# Patient Record
Sex: Female | Born: 1965 | Race: White | Hispanic: No | Marital: Married | Smoking: Never smoker
Health system: Southern US, Community
[De-identification: ages and names within clinical notes are randomized; demographics above are authoritative.]

## PROBLEM LIST (undated history)

## (undated) DIAGNOSIS — G43909 Migraine, unspecified, not intractable, without status migrainosus: Secondary | ICD-10-CM

## (undated) HISTORY — DX: Migraine, unspecified, not intractable, without status migrainosus: G43.909

---

## 2016-09-04 DIAGNOSIS — K529 Noninfective gastroenteritis and colitis, unspecified: Secondary | ICD-10-CM | POA: Diagnosis not present

## 2016-09-04 DIAGNOSIS — M940 Chondrocostal junction syndrome [Tietze]: Secondary | ICD-10-CM | POA: Diagnosis not present

## 2017-10-14 DIAGNOSIS — Z1231 Encounter for screening mammogram for malignant neoplasm of breast: Secondary | ICD-10-CM | POA: Diagnosis not present

## 2017-10-20 DIAGNOSIS — N6002 Solitary cyst of left breast: Secondary | ICD-10-CM | POA: Diagnosis not present

## 2018-01-06 DIAGNOSIS — Z0000 Encounter for general adult medical examination without abnormal findings: Secondary | ICD-10-CM | POA: Diagnosis not present

## 2018-01-06 DIAGNOSIS — G43009 Migraine without aura, not intractable, without status migrainosus: Secondary | ICD-10-CM | POA: Diagnosis not present

## 2018-01-08 ENCOUNTER — Encounter: Payer: Self-pay | Admitting: Neurology

## 2018-02-02 ENCOUNTER — Ambulatory Visit (INDEPENDENT_AMBULATORY_CARE_PROVIDER_SITE_OTHER): Payer: 59 | Admitting: Neurology

## 2018-02-02 ENCOUNTER — Encounter: Payer: Self-pay | Admitting: Neurology

## 2018-02-02 VITALS — BP 116/82 | HR 83 | Ht 61.0 in | Wt 136.0 lb

## 2018-02-02 DIAGNOSIS — R51 Headache: Secondary | ICD-10-CM

## 2018-02-02 DIAGNOSIS — G4486 Cervicogenic headache: Secondary | ICD-10-CM

## 2018-02-02 DIAGNOSIS — R519 Headache, unspecified: Secondary | ICD-10-CM

## 2018-02-02 MED ORDER — TIZANIDINE HCL 4 MG PO TABS: ORAL_TABLET | ORAL | 11 refills | Status: AC

## 2018-02-02 NOTE — Patient Instructions (Addendum)
1. Schedule MRI brain without contrast  We have sent a [facility] to [facility] for [facility] and they will call you directly to schedule your appt. They are located at [address] [facility] [address] If you need to contact them directly please call [phone].   2. Take Tizanidine 4mg  as needed at onset of neck pain/headache. Try taking it at bedtime first, may take up to three times a day 3. Update me in a month on how you are feeling and we can adjust treatment as needed 4. Follow-up in 6 months or so, call for any changes

## 2018-02-02 NOTE — Progress Notes (Signed)
NEUROLOGY CONSULTATION NOTE  Zoii Florer MRN: [id number] DOB: June 06, 1966  Referring provider: Dr. Lia Hopping Primary care provider: Dr. Lia Hopping  Reason for consult:  Headaches, paresthesias  Dear Dr Olena Leatherwood:  Thank you for your kind referral of Daneya Hartgrove for consultation of the above symptoms. Although her history is well known to you, please allow me to reiterate it for the purpose of our medical record. She is alone in the office today. Records and images were personally reviewed where available.  HISTORY OF PRESENT ILLNESS: This is a very pleasant 52 year old right-handed woman with a history of migraines, presenting for a change in her headaches. She reports a history of migraines since childhood with one-sided throbbing pain sometimes like her eye would pop out, with associated nausea and phonophobia. These occur a few times a year, last episode was a few months ago. A few years ago, she started having a different kind of headache that would start at the base of her neck radiating to the back of her head. The pain in her neck would almost be as bad as the headache, with a constant pressure sensation. Pain is usually one-sided, more on the left side, running down her neck and shoulder. It would not completely debilitate her, no associated nausea, but pain would last for 3 days and it would be harder to function by the third day. A couple of times there is a hot sensation in the back of her neck like a liniment was applied. These headaches occur once a week with no clear triggers. She alternates between Tylenol and Advil once a week, she tried Flexeril to go to sleep, but it has not helped much. One time the pain in her neck and back of head woke her up from sleep. In between these episodes, she has no constant neck pain. She has also noticed a different type of pain in her scalp, "like someone hit my skull" and when she lifts her hair, there would be sensitivity. When this  occurs, she also notices a numb pressure (not painful) on the right side of her face "like a balloon." This occurs around 1-2 times a month. She has also been diagnosed with ocular migraines a few years ago, with vision changes where she sees a kaleidoscope or "water running down" without headache. She has seen her eye doctor recently with normal eye exam. She denies any dizziness, diplopia, dysarthria/dysphagia, back pain, focal numbness/tingling/weakness, bowel/bladder dysfunction. She usually gets 6-7 hours of sleep and states she does not feel great/refreshed, she has daytime drowsiness, no snoring. Her mother and 3 children have migraines.   PAST MEDICAL HISTORY: Past Medical History:  Diagnosis Date  . Migraines     PAST SURGICAL HISTORY: History reviewed. No pertinent surgical history.  MEDICATIONS: Current Outpatient Medications on File Prior to Visit  Medication Sig Dispense Refill  . acetaminophen (TYLENOL) 500 MG tablet Take 500 mg by mouth every 8 (eight) hours as needed.    Marland Kitchen ibuprofen (ADVIL,MOTRIN) 200 MG tablet Take 200 mg by mouth every 6 (six) hours as needed.     No current facility-administered medications on file prior to visit.     ALLERGIES: No Known Allergies  FAMILY HISTORY: Family History  Problem Relation Age of Onset  . Lung cancer Father 42  . Asthma Sister   . Diabetes Maternal Grandmother   . Leukemia Paternal Grandmother     SOCIAL HISTORY: Social History   Socioeconomic History  . Marital status: Married  Spouse name: Dannielle HuhDanny  . Number of children: 4  . Years of education: Not on file  . Highest education level: 12th grade  Occupational History  . Occupation: logistics  Social Needs  . Financial resource strain: Not on file  . Food insecurity:    Worry: Not on file    Inability: Not on file  . Transportation needs:    Medical: Not on file    Non-medical: Not on file  Tobacco Use  . Smoking status: Never Smoker  . Smokeless tobacco:  Never Used  Substance and Sexual Activity  . Alcohol use: Yes    Comment: rarely wine  . Drug use: Never  . Sexual activity: Not on file  Lifestyle  . Physical activity:    Days per week: Not on file    Minutes per session: Not on file  . Stress: Not on file  Relationships  . Social connections:    Talks on phone: Not on file    Gets together: Not on file    Attends religious service: Not on file    Active member of club or organization: Not on file    Attends meetings of clubs or organizations: Not on file    Relationship status: Not on file  . Intimate partner violence:    Fear of current or ex partner: Not on file    Emotionally abused: Not on file    Physically abused: Not on file    Forced sexual activity: Not on file  Other Topics Concern  . Not on file  Social History Narrative   Patient is right-handed. She lives with her husband and children in  One level house. She drinks 1-2 cups of coffee a day, and occasionally exercises.    REVIEW OF SYSTEMS: Constitutional: No fevers, chills, or sweats, no generalized fatigue, change in appetite Eyes: No visual changes, double vision, eye pain Ear, nose and throat: No hearing loss, ear pain, nasal congestion, sore throat Cardiovascular: No chest pain, palpitations Respiratory:  No shortness of breath at rest or with exertion, wheezes GastrointestinaI: No nausea, vomiting, diarrhea, abdominal pain, fecal incontinence Genitourinary:  No dysuria, urinary retention or frequency Musculoskeletal:  + neck pain,no back pain Integumentary: No rash, pruritus, skin lesions Neurological: as above Psychiatric: No depression, insomnia, anxiety Endocrine: No palpitations, fatigue, diaphoresis, mood swings, change in appetite, change in weight, increased thirst Hematologic/Lymphatic:  No anemia, purpura, petechiae. Allergic/Immunologic: no itchy/runny eyes, nasal congestion, recent allergic reactions, rashes  PHYSICAL EXAM: Vitals:    02/02/18 1306  BP: 116/82  Pulse: 83  SpO2: 98%   General: No acute distress Head:  Normocephalic/atraumatic, no temporal or occipital tenderness Eyes: Fundoscopic exam shows bilateral sharp discs, no vessel changes, exudates, or hemorrhages Neck: supple, no paraspinal tenderness, full range of motion Back: No paraspinal tenderness Heart: regular rate and rhythm Lungs: Clear to auscultation bilaterally. Vascular: No carotid bruits. Skin/Extremities: No rash, no edema Neurological Exam: Mental status: alert and oriented to person, place, and time, no dysarthria or aphasia, Fund of knowledge is appropriate.  Recent and remote memory are intact.  Attention and concentration are normal.    Able to name objects and repeat phrases. Cranial nerves: CN I: not tested CN II: pupils equal, round and reactive to light, visual fields intact, fundi unremarkable. CN III, IV, VI:  full range of motion, no nystagmus, no ptosis CN V: facial sensation intact CN VII: upper and lower face symmetric CN VIII: hearing intact to finger rub CN IX, X:  gag intact, uvula midline CN XI: sternocleidomastoid and trapezius muscles intact CN XII: tongue midline Bulk & Tone: normal, no fasciculations. Motor: 5/5 throughout with no pronator drift. Sensation: intact to light touch, cold, pin, vibration and joint position sense.  No extinction to double simultaneous stimulation.  Romberg test negative Deep Tendon Reflexes: +2 throughout, no ankle clonus Plantar responses: downgoing bilaterally Cerebellar: no incoordination on finger to nose, heel to shin. No dysdiadochokinesia Gait: narrow-based and steady, able to tandem walk adequately. Tremor: none  IMPRESSION: This is a very pleasant 52 year old right-handed woman with a history of migraines since childhood, presenting with a change in headaches, now with headaches starting in her neck radiating up the back of her head, more on the left side, suggestive of  cervicogenic headaches. She does not give a clear history for occipital neuralgia, but does report scalp sensitivity at times. She also describes a pressure sensation over the right side of her face. Her neurological exam is normal. MRI brain without contrast will be ordered to assess for underlying structural abnormality. We discussed trying a different prn muscle relaxant Tizanidine to help with neck pain/headache, side effects were discussed. If no improvement, we discussed starting low dose nortriptyline. She will update us in a month and will follow-up in 6 months. She knows to call for any changes.   Thank you for allowing me to participate in the care of this patient. Please do not hesitate to call for any questions or concerns.   Patrcia DollyKaren Aquino, M.D.  CC: Dr. Olena LeatherwoodHasanaj

## 2018-02-17 ENCOUNTER — Ambulatory Visit
Admission: RE | Admit: 2018-02-17 | Discharge: 2018-02-17 | Disposition: A | Discharge status: Home / Self Care | Payer: 59 | Source: Ambulatory Visit | Attending: Neurology | Admitting: Neurology

## 2018-02-17 DIAGNOSIS — R51 Headache: Principal | ICD-10-CM

## 2018-02-17 DIAGNOSIS — R519 Headache, unspecified: Secondary | ICD-10-CM

## 2018-02-17 DIAGNOSIS — G4486 Cervicogenic headache: Secondary | ICD-10-CM

## 2018-02-17 DIAGNOSIS — G43909 Migraine, unspecified, not intractable, without status migrainosus: Secondary | ICD-10-CM | POA: Diagnosis not present

## 2018-02-18 ENCOUNTER — Telehealth: Payer: Self-pay

## 2018-02-18 NOTE — Telephone Encounter (Signed)
We had discussed starting a preventative medication, does she want to start this? This is more of a long-term treatment than for her current headache. She can have a migraine cocktail for current headache if she wants. For preventative, we had discussed starting low dose nortriptyline 10mg  qhs, main side effect is drowsiness. Thanks

## 2018-02-18 NOTE — Telephone Encounter (Signed)
-----   Message from Van Clines, MD sent at 02/18/2018  9:54 AM EDT ----- Pls let her know MRI brain looks good, no evidence of tumor, stroke, or bleed. Thanks

## 2018-02-18 NOTE — Telephone Encounter (Signed)
Spoke with relaying below.  She states that she is on day 3 of current headache.  On Tuesday she had a headache, took a Tizanidine and went to bed.  Woke up Wednesday with the same headache.  Today she states that she is going home at lunch and will take a flexeril in hopes that that will help.

## 2018-02-24 MED ORDER — NORTRIPTYLINE HCL 10 MG PO CAPS: 10.0000 mg | ORAL_CAPSULE | Freq: Every day | ORAL | 3 refills | Status: AC

## 2018-02-24 NOTE — Telephone Encounter (Signed)
Spoke with pt.  She states that her headache has been off and on since our last conversation.  Relayed message below about Nortriptyline - pt would like to start.  Verified pt's preferred pharmacy.  Rx sent to [facility] in [place] - Nortriptyline 10mg  #90 with 3 refills.

## 2018-02-24 NOTE — Addendum Note (Signed)
Addended by: Horatio Pel on: 02/24/2018 01:17 PM   Modules accepted: Orders

## 2018-02-26 ENCOUNTER — Encounter

## 2018-02-26 ENCOUNTER — Ambulatory Visit: Payer: Self-pay | Admitting: Neurology

## 2018-03-09 ENCOUNTER — Encounter (INDEPENDENT_AMBULATORY_CARE_PROVIDER_SITE_OTHER): Payer: Self-pay

## 2018-03-09 ENCOUNTER — Telehealth: Payer: Self-pay | Admitting: Neurology

## 2018-03-09 NOTE — Telephone Encounter (Signed)
Patient left a voicemail stating she needed to speak with the nurse regarding her medication. Please call her back at [phone]. Thanks!

## 2018-03-09 NOTE — Telephone Encounter (Signed)
Spoke with pt.  She states that she started taking Nortriptyline on 02/28/18. The next morning had a migraine.  Took medication that night, had a migraine the next morning.  Pt stopped taking Nortriptyline after 3 days.  She alternated tylenol and ibuprofen.  Migraine subsided.  Pt just wanted to keep us up-to-date.   \pt had trouble signing up for MyChart.  MyChart sign-up instructions printed and will be sent to pt.

## 2018-03-10 NOTE — Telephone Encounter (Signed)
Does she want to try a different medication? We can do low dose gabapentin 100mg  qhs x 1 week, then increase to 200mg  qhs. Main side effect is drowsiness. Thanks

## 2018-03-11 NOTE — Telephone Encounter (Signed)
MyChart message sent to pt in regards to message below.

## 2018-03-31 ENCOUNTER — Telehealth: Payer: Self-pay

## 2018-03-31 MED ORDER — GABAPENTIN 100 MG PO CAPS: ORAL_CAPSULE | ORAL | 6 refills | Status: DC

## 2018-03-31 NOTE — Telephone Encounter (Signed)
Gabapentin Rx sent to pharmacy

## 2018-04-02 ENCOUNTER — Other Ambulatory Visit: Payer: Self-pay

## 2018-04-02 DIAGNOSIS — M542 Cervicalgia: Secondary | ICD-10-CM

## 2018-04-02 DIAGNOSIS — R51 Headache: Principal | ICD-10-CM

## 2018-04-02 DIAGNOSIS — R519 Headache, unspecified: Secondary | ICD-10-CM

## 2018-04-02 DIAGNOSIS — G4486 Cervicogenic headache: Secondary | ICD-10-CM

## 2018-04-02 DIAGNOSIS — M79601 Pain in right arm: Secondary | ICD-10-CM

## 2018-04-07 ENCOUNTER — Telehealth: Payer: Self-pay | Admitting: Neurology

## 2018-04-07 NOTE — Telephone Encounter (Signed)
Patient has a question concerning her physical therapy and medication. She is wanting to know about being taken out of work. Please call her back at [phone]. Thanks!

## 2018-04-07 NOTE — Telephone Encounter (Signed)
Responded to via Mychart

## 2018-04-13 ENCOUNTER — Ambulatory Visit: Payer: 59 | Attending: Neurology | Admitting: Physical Therapy

## 2018-04-13 ENCOUNTER — Encounter: Payer: Self-pay | Admitting: Physical Therapy

## 2018-04-13 DIAGNOSIS — M25511 Pain in right shoulder: Secondary | ICD-10-CM | POA: Diagnosis present

## 2018-04-13 DIAGNOSIS — R519 Headache, unspecified: Secondary | ICD-10-CM

## 2018-04-13 DIAGNOSIS — R51 Headache: Secondary | ICD-10-CM | POA: Insufficient documentation

## 2018-04-13 DIAGNOSIS — M542 Cervicalgia: Secondary | ICD-10-CM | POA: Insufficient documentation

## 2018-04-13 NOTE — Therapy (Signed)
[facility] [address] [place], [place], [address] Phone: [phone]   Fax:  [phone]  Physical Therapy Evaluation  Patient Details  Name: Victoria Wilkins MRN: [id number] Date of Birth: 1966/01/01 Referring Provider (PT): Patrcia Dolly MD   Encounter Date: 04/13/2018  PT End of Session - 04/13/18 1038    Visit Number  1    Number of Visits  12    Date for PT Re-Evaluation  05/25/18    Authorization Type  [employer]    PT Start Time  0850    PT Stop Time  0935    PT Time Calculation (min)  45 min    Activity Tolerance  Patient tolerated treatment well       Past Medical History:  Diagnosis Date  . Migraines     History reviewed. No pertinent surgical history.  There were no vitals filed for this visit.   Subjective Assessment - 04/13/18 0855    Subjective  Pt reports migranes since she was a kid. She has been having consistent neck pain and Rt arm pain for last few weeks that is getting worse and aggravated by office and sitting work and turning to reach behind her. She relays referred pain down to elbow that maybe feels numb but denies tingling.  She experiences some visual distrurbances, warm sensations in her neck, She does not get a headache/migraine every week but does get one at least every other week lasting 2-3 days at a time. She saw Neurologist who prescribed gabapentin which may shorten duration of headaches to one day but has not helped with neck/arm pain.     Pertinent History  chronic migranes since kid    Limitations  Reading;Lifting;House hold activities    Diagnostic tests  MRI of brain, negative    Currently in Pain?  Yes    Pain Score  5     Pain Location  Neck    Pain Orientation  Right;Left    Pain Descriptors / Indicators  Aching;Constant    Pain Type  Acute pain   acute on chronic   Pain Radiating Towards  Rt arm down to elbow    Pain Onset  1 to 4 weeks ago    Pain Frequency  Constant     Aggravating Factors   looking down, reaching    Pain Relieving Factors  heat, maybe meds         [facility] PT Assessment - 04/13/18 0001      Assessment   Medical Diagnosis  cervicogenic headache, neck and Rt arm pain    Referring Provider (PT)  Patrcia Dolly MD    Onset Date/Surgical Date  --   4 week acute on chronic   Next MD Visit  --   not until march 2020   Prior Therapy  none      Precautions   Precautions  None      Balance Screen   Has the patient fallen in the past 6 months  No      Home Environment   Living Environment  Private residence      Prior Function   Level of Independence  Independent   currently still Ind but pain with anything reaching   Vocation  Full time employment   currently taken out of work a couple weeks by MD   [employer] work      Cognition   Overall Cognitive Status  Within Functional Limits for tasks assessed  Attention  Focused      Sensation   Light Touch  Appears Intact      Posture/Postural Control   Posture Comments  mild fwd head and rounded shoulders      ROM / Strength   AROM / PROM / Strength  AROM;PROM;Strength      AROM   Overall AROM Comments  % available, limited by pain, PROM WNL    AROM Assessment Site  Cervical;Shoulder    Right/Left Shoulder  Right    Right Shoulder Flexion  --   WFL   Right Shoulder ABduction  90 Degrees    Right Shoulder Internal Rotation  --   WFL   Right Shoulder External Rotation  --   WFL   Cervical Flexion  25% available    Cervical Extension  25%    Cervical - Right Side Bend  25%    Cervical - Left Side Bend  25%    Cervical - Right Rotation  75%    Cervical - Left Rotation  50%      PROM   Overall PROM Comments  neck and shoulder WNL      Strength   Overall Strength Comments  elbow WNL bilat, Lt shoulder WNL    Strength Assessment Site  Shoulder    Right/Left Shoulder  Right    Right Shoulder Flexion  4+/5    Right Shoulder ABduction  4/5    Right  Shoulder Internal Rotation  4+/5    Right Shoulder External Rotation  4/5      Flexibility   Soft Tissue Assessment /Muscle Length  --   tight suboccipitals, levator, UT     Palpation   Spinal mobility  decreased    Palpation comment  TTP in UT, cervical P.S, and suboccipitals, gentle palpaiton caused "tingling down arms and legs"      Special Tests   Other special tests  + spurlings test for spinal compression and facet arthropathy, inconclusive result with manual traction, + Rt shoulder impingment tests      Transfers   Comments  independent bed and chair tfers      Ambulation/Gait   Gait Comments  WNL gait pattern                Objective measurements completed on examination: See above findings.      [facility] Adult PT Treatment/Exercise - 04/13/18 0001      Modalities   Modalities  Moist Heat      Moist Heat Therapy   Number Minutes Moist Heat  10 Minutes   with HEP review   Moist Heat Location  Cervical      Manual Therapy   Manual therapy comments  neck PROM 5 reps each plane, Suboccipital release 3 min, manual traction with towel 30 sec X 3             PT Education - 04/13/18 1004    Education Details  HEP, POC, exam findings    Person(s) Educated  Patient    Methods  Explanation;Demonstration;Verbal cues;Handout    Comprehension  Verbalized understanding;Need further instruction          PT Long Term Goals - 04/13/18 1056      PT LONG TERM GOAL #1   Title  Pt will improve neck and Rt shoulder ROM to [facility]. 6 weeks 05/25/18    Status  New      PT LONG TERM GOAL #2   Title  Pt will be  I and compliant with HEP. 6 weeks 05/25/18    Status  New      PT LONG TERM GOAL #3   Title  Pt will report reduced headaches to no more than 1-2 per month. 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 6 weeks 05/25/18    Status  New              Plan - 04/13/18 1041    Clinical Impression Statement  Pt presents with neck and Rt arm pain, cervicogenic headaches. Headaches are acute on chronic and somewhat managed by new meds which delay duration of headaches to one day vs 2-3. She has good PROM but decreased neck and shoulder ROM due to pain, decreased Rt shoulder strength, + impingment signs on Rt shoulder, increased tightness and compression on neck paraspinals, sub occipitals, and traps, and incrased pain limiting her function. She has inconsistent radicular signs and MRI of brain was neg however she has not had imaging of her neck and shoulder. She will benefit from skilled PT to address her deficits and improve function.     History and Personal Factors relevant to plan of care:  chronic nature of headaches    Clinical Presentation  Evolving    Clinical Presentation due to:  worsening arm/neck pain over last 3-4 weeks    Clinical Decision Making  Moderate    Rehab Potential  Fair    PT Frequency  2x / week    PT Duration  6 weeks    PT Treatment/Interventions  Cryotherapy;Electrical Stimulation;Iontophoresis 4mg /ml Dexamethasone;Moist Heat;Traction;Ultrasound;Therapeutic exercise;Therapeutic activities;Neuromuscular re-education;Manual techniques;Passive range of motion;Dry needling;Taping;Spinal Manipulations    PT Next Visit Plan  review hep for gentle neck and shoulder ROM and stretching, consider MT for STM, traction, Suboccip relase, C-T spine mobs and DN?    PT Home Exercise Plan  [id number]    Consulted and Agree with Plan of Care  Patient       Patient will benefit from skilled therapeutic intervention in order to improve the following deficits and impairments:  Decreased activity tolerance, Decreased endurance, Decreased range of motion, Decreased strength, Hypomobility, Increased fascial restricitons, Increased muscle spasms, Postural dysfunction, Pain  Visit Diagnosis: Cervicalgia  Acute pain of right  shoulder  Chronic intractable headache, unspecified headache type     Problem List There are no active problems to display for this patient.   April Manson, PT, DPT 04/13/2018, 11:00 AM  [facility] [address] [place], [place], [address] Phone: [phone]   Fax:  [phone]  Name: Victoria Wilkins MRN: [id number] Date of Birth: [birth year]

## 2018-04-16 ENCOUNTER — Other Ambulatory Visit: Payer: Self-pay | Admitting: Neurology

## 2018-04-16 ENCOUNTER — Ambulatory Visit: Payer: 59 | Attending: Neurology | Admitting: Physical Therapy

## 2018-04-16 DIAGNOSIS — G8929 Other chronic pain: Secondary | ICD-10-CM

## 2018-04-16 DIAGNOSIS — M25511 Pain in right shoulder: Secondary | ICD-10-CM | POA: Insufficient documentation

## 2018-04-16 DIAGNOSIS — R519 Headache, unspecified: Secondary | ICD-10-CM

## 2018-04-16 DIAGNOSIS — M542 Cervicalgia: Secondary | ICD-10-CM | POA: Insufficient documentation

## 2018-04-16 DIAGNOSIS — R51 Headache: Secondary | ICD-10-CM | POA: Diagnosis present

## 2018-04-16 MED ORDER — GABAPENTIN 100 MG PO CAPS: ORAL_CAPSULE | ORAL | 6 refills | Status: DC

## 2018-04-16 NOTE — Therapy (Signed)
[facility] [address] [place], [place], [address] Phone: [phone]   Fax:  [phone]  Physical Therapy Treatment  Patient Details  Name: Victoria Wilkins MRN: [id number] Date of Birth: [birth year] Referring Provider (PT): Patrcia Dolly MD   Encounter Date: 04/16/2018  PT End of Session - 04/16/18 1108    Visit Number  2    Number of Visits  12    Date for PT Re-Evaluation  05/25/18    Authorization Type  UHC    PT Start Time  0850    PT Stop Time  0940    PT Time Calculation (min)  50 min    Activity Tolerance  Patient tolerated treatment well    Behavior During Therapy  [facility] for tasks assessed/performed       Past Medical History:  Diagnosis Date  . Migraines     No past surgical history on file.  There were no vitals filed for this visit.  Subjective Assessment - 04/16/18 1102    Subjective  Pt reports she had a bad day yesterday with posterior head/neck pain but it is feeling better today. She relays compliance with HEP. She relays she was also reached out to MD about trying to have her neck imaged or have an ortho consult for neck.    Currently in Pain?  Yes    Pain Score  5     Pain Location  Neck    Pain Orientation  Right;Posterior    Pain Descriptors / Indicators  Aching;Tightness;Numbness    Pain Type  Acute pain    Pain Radiating Towards  Rt UE    Pain Onset  1 to 4 weeks ago    Pain Frequency  Intermittent                       [facility] Adult PT Treatment/Exercise - 04/16/18 0001      Exercises   Exercises  Neck      Neck Exercises: Seated   Neck Retraction  20 reps      Modalities   Modalities  Electrical Stimulation;Moist Heat      Moist Heat Therapy   Number Minutes Moist Heat  15 Minutes    Moist Heat Location  Cervical      Electrical Stimulation   Electrical Stimulation Location  cervical    Electrical Stimulation Action  IFC    Electrical Stimulation  Parameters  80-134mhz to tolearance    Electrical Stimulation Goals  Pain;Tone      Manual Therapy   Manual therapy comments  gentle manual traction with towel, poor tolerance to suboccipital relase so instead she was moved to sitting and STM perfomed to subociptals, paraspinals, and traps      Neck Exercises: Stretches   Upper Trapezius Stretch  Right;Left;2 reps;30 seconds    Levator Stretch  Right;Left;2 reps;30 seconds             PT Education - 04/16/18 1108    Education Details  HEP review    Person(s) Educated  Patient    Methods  Explanation;Demonstration    Comprehension  Verbalized understanding;Returned demonstration          PT Long Term Goals - 04/13/18 1056      PT LONG TERM GOAL #1   Title  Pt will improve neck and Rt shoulder ROM to [facility]. 6 weeks 05/25/18    Status  New      PT LONG  TERM GOAL #2   Title  Pt will be I and compliant with HEP. 6 weeks 05/25/18    Status  New      PT LONG TERM GOAL #3   Title  Pt will report reduced headaches to no more than 1-2 per month. 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 6 weeks 05/25/18    Status  New            Plan - 04/16/18 1109    Clinical Impression Statement  Pt was trialed today with TENS along with heat in efforts to try to reduce pain, guarding, and spasm prior to stretching and manual therapy. She expresses this may have helped. HEP was reviewed and she showed good unstanding and technique with HEP. Manual therapy performed for manual traction and sub occipital release, she has poor tolerance to pressure applied in cervical muscles for manual traction instead performed with PT gently pulling towel. Pt relays some dizziness when traction is released. During soft tissue massage she expresses her Rt ear popped and opened up as if it was closed or clogged before. PT will assess her response to manual  therapy next visit for effectiveness.     Rehab Potential  Fair    PT Frequency  2x / week    PT Duration  6 weeks    PT Treatment/Interventions  Cryotherapy;Electrical Stimulation;Iontophoresis 4mg /ml Dexamethasone;Moist Heat;Traction;Ultrasound;Therapeutic exercise;Therapeutic activities;Neuromuscular re-education;Manual techniques;Passive range of motion;Dry needling;Taping;Spinal Manipulations    PT Next Visit Plan  assess response to manual therapy and TENS, last visit. gentle neck and shoulder ROM and stretching, may begin neck isometrics, consider MT for STM, traction, Suboccip relase, C-T spine mobs and DN?    PT Home Exercise Plan  [id number]    Consulted and Agree with Plan of Care  Patient       Patient will benefit from skilled therapeutic intervention in order to improve the following deficits and impairments:  Decreased activity tolerance, Decreased endurance, Decreased range of motion, Decreased strength, Hypomobility, Increased fascial restricitons, Increased muscle spasms, Postural dysfunction, Pain  Visit Diagnosis: Cervicalgia  Acute pain of right shoulder  Chronic intractable headache, unspecified headache type     Problem List There are no active problems to display for this patient.   April Manson, PT,DPT 04/16/2018, 11:16 AM  [facility] [address] [place], [place], [address] Phone: [phone]   Fax:  [phone]  Name: Victoria Wilkins MRN: [id number] Date of Birth: Jun 18, 1965

## 2018-04-19 ENCOUNTER — Telehealth: Payer: Self-pay | Admitting: Neurology

## 2018-04-19 NOTE — Telephone Encounter (Signed)
Amil Amen called from [employer] and would like to speak with you. Please Call. Thanks

## 2018-04-20 ENCOUNTER — Ambulatory Visit: Payer: 59 | Admitting: Physical Therapy

## 2018-04-20 DIAGNOSIS — R51 Headache: Secondary | ICD-10-CM

## 2018-04-20 DIAGNOSIS — M542 Cervicalgia: Secondary | ICD-10-CM

## 2018-04-20 DIAGNOSIS — R519 Headache, unspecified: Secondary | ICD-10-CM

## 2018-04-20 DIAGNOSIS — M25511 Pain in right shoulder: Secondary | ICD-10-CM

## 2018-04-20 NOTE — Therapy (Signed)
[facility] [address] [place], [place], [address] Phone: [phone]   Fax:  [phone]  Physical Therapy Treatment  Patient Details  Name: Victoria Wilkins MRN: [id number] Date of Birth: [birth year] Referring Provider (PT): Patrcia Dolly MD   Encounter Date: 04/20/2018  PT End of Session - 04/20/18 0858    Visit Number  3    Number of Visits  12    Date for PT Re-Evaluation  05/25/18    Authorization Type  UHC    PT Start Time  0845    PT Stop Time  0935    PT Time Calculation (min)  50 min    Activity Tolerance  Patient tolerated treatment well    Behavior During Therapy  [facility] for tasks assessed/performed       Past Medical History:  Diagnosis Date  . Migraines     No past surgical history on file.  There were no vitals filed for this visit.  Subjective Assessment - 04/20/18 0855    Subjective  Pt relays she felt good the rest of the day friday after last session but then the headache came back on sunday and it felt like she was hit by 2X4. She relays MD has inc her gabapentin a litle bit. She relays she feels TENS helped some. She does relay the sharp pain in her upper Rt arm has improved.    Currently in Pain?  Yes    Pain Score  6     Pain Location  Neck   head and neck   Pain Orientation  Right;Left;Posterior    Pain Descriptors / Indicators  Aching;Tightness         [facility] PT Assessment - 04/20/18 0001      AROM   Overall AROM Comments  %limited    AROM Assessment Site  Cervical;Shoulder    Cervical Flexion  25%    Cervical Extension  50%    Cervical - Right Side Bend  75%    Cervical - Left Side Bend  25%    Cervical - Right Rotation  75%    Cervical - Left Rotation  75%                   [facility] Adult PT Treatment/Exercise - 04/20/18 0001      Exercises   Exercises  Neck      Neck Exercises: Seated   Other Seated Exercise  rows and scap retraction/bilat ER 15 sec ea, 5 sec  hold    Other Seated Exercise  seated neck rotation mob with movement with towel,  x10 each side hold 5 sec      Neck Exercises: Supine   Neck Retraction  20 reps      Modalities   Modalities  Electrical Stimulation;Moist Heat      Moist Heat Therapy   Number Minutes Moist Heat  15 Minutes    Moist Heat Location  Cervical      Electrical Stimulation   Electrical Stimulation Location  cervical    Electrical Stimulation Action  IFC    Electrical Stimulation Parameters  80-150 hz    Electrical Stimulation Goals  Pain;Tone      Manual Therapy   Manual therapy comments  PROM SB and ROT X 20 ea in supine, then pt prone for STM and gentle grade 1 cervical mobs central PA and Lt unilat       Neck Exercises: Stretches   Upper Trapezius Stretch  Right;Left;2 reps;30 seconds    Levator Stretch  Right;Left;2 reps;30 seconds                  PT Long Term Goals - 04/13/18 1056      PT LONG TERM GOAL #1   Title  Pt will improve neck and Rt shoulder ROM to [facility]. 6 weeks 05/25/18    Status  New      PT LONG TERM GOAL #2   Title  Pt will be I and compliant with HEP. 6 weeks 05/25/18    Status  New      PT LONG TERM GOAL #3   Title  Pt will report reduced headaches to no more than 1-2 per month. 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 6 weeks 05/25/18    Status  New            Plan - 04/20/18 0901    Clinical Impression Statement  Session began with MHP and TENS as she relays this helped some last session. She also says she has a TENS unit she got from family member she did not know how to position the pads so she was shown today. PT took picture with patients phone with her permission so she could see the set up at home along the cervical paraspinals bilat. Session again focused on gentle neck ROM with begining scapular and upper back strengthening followed by manual  therapy to decrease Soft tissue restrictions and increase spinal mobility.  She did have some improvements in neck ROM upon assessment, see updated measurements.    Rehab Potential  Fair    PT Frequency  2x / week    PT Duration  6 weeks    PT Treatment/Interventions  Cryotherapy;Electrical Stimulation;Iontophoresis 4mg /ml Dexamethasone;Moist Heat;Traction;Ultrasound;Therapeutic exercise;Therapeutic activities;Neuromuscular re-education;Manual techniques;Passive range of motion;Dry needling;Taping;Spinal Manipulations    PT Next Visit Plan  assess response to manual therapy and Trial of cervical mobs,gentle neck and shoulder ROM and stretching, may begin neck isometrics, consider MT for STM, traction, Suboccip relase, C-T spine mobs and DN?    PT Home Exercise Plan  [id number]    Consulted and Agree with Plan of Care  Patient       Patient will benefit from skilled therapeutic intervention in order to improve the following deficits and impairments:  Decreased activity tolerance, Decreased endurance, Decreased range of motion, Decreased strength, Hypomobility, Increased fascial restricitons, Increased muscle spasms, Postural dysfunction, Pain  Visit Diagnosis: Cervicalgia  Acute pain of right shoulder  Chronic intractable headache, unspecified headache type     Problem List There are no active problems to display for this patient.   April Manson, PT, DPT 04/20/2018, 10:28 AM  [facility] [address] [place], [place], [address] Phone: [phone]   Fax:  [phone]  Name: Victoria Wilkins MRN: [id number] Date of Birth: [birth year]

## 2018-04-23 ENCOUNTER — Ambulatory Visit: Payer: 59

## 2018-04-23 DIAGNOSIS — G8929 Other chronic pain: Secondary | ICD-10-CM

## 2018-04-23 DIAGNOSIS — R51 Headache: Secondary | ICD-10-CM

## 2018-04-23 DIAGNOSIS — R519 Headache, unspecified: Secondary | ICD-10-CM

## 2018-04-23 DIAGNOSIS — M542 Cervicalgia: Secondary | ICD-10-CM | POA: Diagnosis not present

## 2018-04-23 DIAGNOSIS — M25511 Pain in right shoulder: Secondary | ICD-10-CM

## 2018-04-23 NOTE — Therapy (Signed)
[facility] [address] [place], [place], [address] Phone: [phone]   Fax:  [phone]  Physical Therapy Treatment  Patient Details  Name: Victoria Wilkins MRN: [id number] Date of Birth: [birth year] Referring Provider (PT): Patrcia Dolly MD   Encounter Date: 04/23/2018  PT End of Session - 04/23/18 0849    Visit Number  4    Number of Visits  12    Date for PT Re-Evaluation  05/25/18    Authorization Type  UHC    PT Start Time  0849    PT Stop Time  0934    PT Time Calculation (min)  45 min    Equipment Utilized During Treatment  Gait belt    Activity Tolerance  Patient tolerated treatment well    Behavior During Therapy  [facility] for tasks assessed/performed       Past Medical History:  Diagnosis Date  . Migraines     History reviewed. No pertinent surgical history.  There were no vitals filed for this visit.  Subjective Assessment - 04/23/18 0850    Subjective  Pt stated she had a terrible headache monday and then caught the stomach bug but is feeling better today. HEP is going well but pt hasnt been able to do them the last few days due to being sick.     Pertinent History  chronic migranes since kid    Limitations  Reading;Lifting;House hold activities    Diagnostic tests  MRI of brain, negative    Currently in Pain?  Yes    Pain Score  3     Pain Location  Neck    Pain Orientation  Posterior    Pain Descriptors / Indicators  Aching;Other (Comment)   "something stuck in it"   Pain Type  Acute pain    Pain Onset  More than a month ago    Pain Frequency  Constant   pain level varies but constant   Aggravating Factors   sitting up, standing, activity    Pain Relieving Factors  heat, supporting the neck        [facility] Adult PT Treatment/Exercise - 04/23/18 1551      Neck Exercises: Seated   Other Seated Exercise  AAROM with wooden dowel in all ranges     Other Seated Exercise  Supine neck rotation with  towel under spine with neck resting on tennis balls      Modalities   Modalities  Moist Heat      Moist Heat Therapy   Number Minutes Moist Heat  10 Minutes    Moist Heat Location  Cervical      Neck Exercises: Stretches   Upper Trapezius Stretch  Right;Left;5 reps;30 seconds    Levator Stretch  Right;Left;5 reps;30 seconds    Neck Stretch  5 reps;30 seconds    Other Neck Stretches  Upper limb neural tension radial stretch while seated    Other Neck Stretches  AROM rotation with ball on wall in standing.         PT Long Term Goals - 04/13/18 1056      PT LONG TERM GOAL #1   Title  Pt will improve neck and Rt shoulder ROM to [facility]. 6 weeks 05/25/18    Status  New      PT LONG TERM GOAL #2   Title  Pt will be I and compliant with HEP. 6 weeks 05/25/18    Status  New  PT LONG TERM GOAL #3   Title  Pt will report reduced headaches to no more than 1-2 per month. 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 6 weeks 05/25/18    Status  New        Plan - 04/23/18 2342    Clinical Impression Statement  Todays skilled session focused on gentle neck/shoulder ROM/stretching to improve range in all directions. Pt should benefit from continued PT sessions to work toward goals.     Rehab Potential  Fair    PT Frequency  2x / week    PT Duration  6 weeks    PT Treatment/Interventions  Cryotherapy;Electrical Stimulation;Iontophoresis 4mg /ml Dexamethasone;Moist Heat;Traction;Ultrasound;Therapeutic exercise;Therapeutic activities;Neuromuscular re-education;Manual techniques;Passive range of motion;Dry needling;Taping;Spinal Manipulations    PT Next Visit Plan  assess response to manual therapy and Trial of cervical mobs,gentle neck and shoulder ROM and stretching, may begin neck isometrics, consider MT for STM, traction, Suboccip relase, C-T spine mobs and DN?    PT Home Exercise Plan   [id number]    Consulted and Agree with Plan of Care  Patient       Patient will benefit from skilled therapeutic intervention in order to improve the following deficits and impairments:  Decreased activity tolerance, Decreased endurance, Decreased range of motion, Decreased strength, Hypomobility, Increased fascial restricitons, Increased muscle spasms, Postural dysfunction, Pain  Visit Diagnosis: Cervicalgia  Acute pain of right shoulder  Chronic intractable headache, unspecified headache type     Problem List There are no active problems to display for this patient.   , PTA   A  04/23/2018, 11:56 PM  [facility] [address] [place], [place], [address] Phone: [phone]   Fax:  [phone]  Name: Victoria Wilkins MRN: [id number] Date of Birth: Jun 30, 1965

## 2018-04-27 ENCOUNTER — Ambulatory Visit: Payer: 59 | Admitting: Physical Therapy

## 2018-04-27 ENCOUNTER — Encounter: Payer: Self-pay | Admitting: Physical Therapy

## 2018-04-27 DIAGNOSIS — R51 Headache: Secondary | ICD-10-CM

## 2018-04-27 DIAGNOSIS — M542 Cervicalgia: Secondary | ICD-10-CM | POA: Diagnosis not present

## 2018-04-27 DIAGNOSIS — M25511 Pain in right shoulder: Secondary | ICD-10-CM

## 2018-04-27 DIAGNOSIS — G8929 Other chronic pain: Secondary | ICD-10-CM

## 2018-04-27 DIAGNOSIS — R519 Headache, unspecified: Secondary | ICD-10-CM

## 2018-04-27 NOTE — Patient Instructions (Signed)

## 2018-04-27 NOTE — Therapy (Signed)
[facility] [address] [place], [place], [address] Phone: [phone]   Fax:  [phone]  Physical Therapy Treatment  Patient Details  Name: Victoria Wilkins MRN: [id number] Date of Birth: 03/23/1966 Referring Provider (PT): Patrcia DollyKaren Aquino MD   Encounter Date: 04/27/2018  PT End of Session - 04/27/18 0937    Visit Number  5    Number of Visits  12    Date for PT Re-Evaluation  05/25/18    Authorization Type  UHC    PT Start Time  0933    PT Stop Time  1020   8 min moist heat   PT Time Calculation (min)  47 min    Activity Tolerance  Patient tolerated treatment well    Behavior During Therapy  [facility] for tasks assessed/performed       Past Medical History:  Diagnosis Date  . Migraines     History reviewed. No pertinent surgical history.  There were no vitals filed for this visit.  Subjective Assessment - 04/27/18 0936    Subjective  back of head is hurting with headache; went back to work this week with some increased pain    Pertinent History  chronic migranes since kid    Diagnostic tests  MRI of brain, negative    Currently in Pain?  Yes    Pain Score  5     Pain Location  Head    Pain Orientation  Posterior    Pain Descriptors / Indicators  Aching;Headache    Pain Type  Acute pain                       [facility] Adult PT Treatment/Exercise - 04/27/18 0001      Exercises   Exercises  Neck      Neck Exercises: Seated   Other Seated Exercise  B shoulder horizontal abduction + scap squeeze with red tband x 15    Other Seated Exercise  B shoulder ER + scap squeeze with red tband x 15 reps      Modalities   Modalities  Moist Heat      Moist Heat Therapy   Number Minutes Moist Heat  8 Minutes    Moist Heat Location  Shoulder;Cervical      Manual Therapy   Manual Therapy  Joint mobilization;Soft tissue mobilization;Myofascial release    Manual therapy comments  patient prone and supine  with bolster at knees; skillful palpation during TDN treatment    Joint Mobilization  gentle grade II CPAs of lower cervical and upper thoracic spine 3 x 15 sec each segment    Soft tissue mobilization  STM to B UT, B LS, R infra, B cervical paraspinals    Myofascial Release  manual trigger point release to B UT, R infr, B suboccipital release 3 x 30 seconds      Neck Exercises: Stretches   Other Neck Stretches  open book - 5 x 10 sec bilateral       Trigger Point Dry Needling - 04/27/18 1036    Consent Given?  Yes    Education Handout Provided  Yes    Muscles Treated Upper Body  Suboccipitals muscle group    SubOccipitals Response  Twitch response elicited;Palpable increased muscle length   R side only          PT Education - 04/27/18 1033    Education Details  education on DN justification and expectations    Person(s)  Educated  Patient    Methods  Explanation;Demonstration;Handout    Comprehension  Verbalized understanding          PT Long Term Goals - 04/13/18 1056      PT LONG TERM GOAL #1   Title  Pt will improve neck and Rt shoulder ROM to [facility]. 6 weeks 05/25/18    Status  New      PT LONG TERM GOAL #2   Title  Pt will be I and compliant with HEP. 6 weeks 05/25/18    Status  New      PT LONG TERM GOAL #3   Title  Pt will report reduced headaches to no more than 1-2 per month. 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 6 weeks 05/25/18    Status  New            Plan - 04/27/18 1034    Clinical Impression Statement  Patient today with continued reports of posterior head pain and headache. Educated patient on TDN treatment with handout given with patient verballky consenting. TDN to R suboccipital mm group with twitch response and reduced muscle tension. Manual therapy and periscapular strengthening for remainder of session with good tolerance. Updated  HEP.     Rehab Potential  Fair    PT Frequency  2x / week    PT Duration  6 weeks    PT Treatment/Interventions  Cryotherapy;Electrical Stimulation;Iontophoresis 4mg /ml Dexamethasone;Moist Heat;Traction;Ultrasound;Therapeutic exercise;Therapeutic activities;Neuromuscular re-education;Manual techniques;Passive range of motion;Dry needling;Taping;Spinal Manipulations    PT Next Visit Plan  assess response to manual therapy and Trial of cervical mobs,gentle neck and shoulder ROM and stretching, may begin neck isometrics, consider MT for STM, traction, Suboccip relase, C-T spine mobs and DN?    PT Home Exercise Plan  [id number]    Consulted and Agree with Plan of Care  Patient       Patient will benefit from skilled therapeutic intervention in order to improve the following deficits and impairments:  Decreased activity tolerance, Decreased endurance, Decreased range of motion, Decreased strength, Hypomobility, Increased fascial restricitons, Increased muscle spasms, Postural dysfunction, Pain  Visit Diagnosis: Cervicalgia  Acute pain of right shoulder  Chronic intractable headache, unspecified headache type     Problem List There are no active problems to display for this patient.    Kipp Laurence, PT, DPT Supplemental Physical Therapist 04/27/18 10:40 AM Pager: [phone] Office: [phone]  [facility] [address] [place], [place], [address] Phone: [phone]   Fax:  [phone]  Name: Avneet Ashmore MRN: [id number] Date of Birth: 09-01-1965

## 2018-04-30 ENCOUNTER — Ambulatory Visit: Payer: 59 | Admitting: Physical Therapy

## 2018-05-04 ENCOUNTER — Ambulatory Visit: Payer: 59 | Admitting: Physical Therapy

## 2018-05-04 DIAGNOSIS — R51 Headache: Secondary | ICD-10-CM | POA: Diagnosis not present

## 2018-05-04 DIAGNOSIS — M542 Cervicalgia: Secondary | ICD-10-CM | POA: Diagnosis not present

## 2018-05-04 DIAGNOSIS — M25511 Pain in right shoulder: Secondary | ICD-10-CM

## 2018-05-04 DIAGNOSIS — R519 Headache, unspecified: Secondary | ICD-10-CM

## 2018-05-04 NOTE — Therapy (Signed)
[facility] [address] [place], [place], [address] Phone: [phone]   Fax:  [phone]  Physical Therapy Treatment  Patient Details  Name: Victoria Wilkins MRN: [id number] Date of Birth: 02/21/1966 Referring Provider (PT): Patrcia Dolly MD   Encounter Date: 05/04/2018  PT End of Session - 05/04/18 1319    Visit Number  6    Number of Visits  12    Date for PT Re-Evaluation  05/25/18    Authorization Type  [employer]    PT Start Time  1150    PT Stop Time  1236   first 8 min on heat   PT Time Calculation (min)  46 min    Activity Tolerance  Patient tolerated treatment well    Behavior During Therapy  [facility] for tasks assessed/performed       Past Medical History:  Diagnosis Date  . Migraines     No past surgical history on file.  There were no vitals filed for this visit.  Subjective Assessment - 05/04/18 1315    Subjective  Pt reports her neck/head are feeling better, she does not know for sure if it was DN or combo of everything PT is doing.    Currently in Pain?  Yes    Pain Score  2     Pain Location  Head                       [facility] Adult PT Treatment/Exercise - 05/04/18 0001      Exercises   Exercises  Neck      Neck Exercises: Theraband   Other Theraband Exercises  Horizontal abd and bilat ER with red Tband supine today X 20 ea      Neck Exercises: Supine   Neck Retraction  20 reps      Modalities   Modalities  Moist Heat      Moist Heat Therapy   Number Minutes Moist Heat  8 Minutes    Moist Heat Location  Shoulder;Cervical      Manual Therapy   Manual Therapy  Joint mobilization;Soft tissue mobilization;Myofascial release    Manual therapy comments  pt prone and supine    Joint Mobilization  gentle grade II CPAs of lower cervical and upper thoracic spine 3 x 15 sec each segment    Soft tissue mobilization  STM to B UT, B LS, R infra, B cervical paraspinals    Myofascial  Release  manual trigger point release to B UT, R infr, B suboccipital release 3 x 30 seconds      Neck Exercises: Stretches   Upper Trapezius Stretch  Right;Left;2 reps;30 seconds    Upper Trapezius Stretch Limitations  supine    Levator Stretch  Right;Left;2 reps;30 seconds    Levator Stretch Limitations  supine                  PT Long Term Goals - 04/13/18 1056      PT LONG TERM GOAL #1   Title  Pt will improve neck and Rt shoulder ROM to [facility]. 6 weeks 05/25/18    Status  New      PT LONG TERM GOAL #2   Title  Pt will be I and compliant with HEP. 6 weeks 05/25/18    Status  New      PT LONG TERM GOAL #3   Title  Pt will report reduced headaches to no more than 1-2 per  month. 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 6 weeks 05/25/18    Status  New            Plan - 05/04/18 1319    Clinical Impression Statement  Pt reports some progress today with her neck pain and headaches and she relays compliance with new HEP and using home TENS unit. Session again focused on neck/scapular stretching and strenghtening with heavy focus on MT for C-T spine mobs and STM,MFR. PT will continue to progress as able    Rehab Potential  Fair    PT Frequency  2x / week    PT Duration  6 weeks    PT Treatment/Interventions  Cryotherapy;Electrical Stimulation;Iontophoresis 4mg /ml Dexamethasone;Moist Heat;Traction;Ultrasound;Therapeutic exercise;Therapeutic activities;Neuromuscular re-education;Manual techniques;Passive range of motion;Dry needling;Taping;Spinal Manipulations    PT Next Visit Plan  neck stretch, strengthening, DN and MT as needed    PT Home Exercise Plan  [id number]    Consulted and Agree with Plan of Care  Patient       Patient will benefit from skilled therapeutic intervention in order to improve the following deficits and impairments:  Decreased activity tolerance,  Decreased endurance, Decreased range of motion, Decreased strength, Hypomobility, Increased fascial restricitons, Increased muscle spasms, Postural dysfunction, Pain  Visit Diagnosis: Cervicalgia  Acute pain of right shoulder  Chronic intractable headache, unspecified headache type     Problem List There are no active problems to display for this patient.   April MansonBrian R Nafeesa Dils, PT,DPT 05/04/2018, 1:22 PM   [facility] [address] [place], [place], [address] Phone: [phone]   Fax:  [phone]  Name: Victoria Wilkins MRN: [id number] Date of Birth: 09/16/1965

## 2018-05-07 ENCOUNTER — Ambulatory Visit: Payer: 59 | Admitting: Physical Therapy

## 2018-05-07 DIAGNOSIS — M542 Cervicalgia: Secondary | ICD-10-CM | POA: Diagnosis not present

## 2018-05-07 DIAGNOSIS — M25511 Pain in right shoulder: Secondary | ICD-10-CM

## 2018-05-07 DIAGNOSIS — G8929 Other chronic pain: Secondary | ICD-10-CM

## 2018-05-07 DIAGNOSIS — R519 Headache, unspecified: Secondary | ICD-10-CM

## 2018-05-07 DIAGNOSIS — R51 Headache: Secondary | ICD-10-CM

## 2018-05-07 NOTE — Patient Instructions (Signed)
Access Code: [email or web address]  URL: [email or web address]  Date: 05/07/2018  Prepared by: Ivery QualeBrian Henessy Rohrer   Exercises  Standing Shoulder Posterior Capsule Stretch - 10 reps - 3 sets - 2x daily - 6x weekly  Doorway Pec Stretch at 90 Degrees Abduction - 10 reps - 3 sets - 2x daily - 6x weekly  Standing Shoulder Row with Anchored Resistance - 10 reps - 3 sets - 2x daily - 6x weekly  Shoulder Extension with Resistance - Neutral - 10 reps - 3 sets - 2x daily - 6x weekly  Shoulder External Rotation with Anchored Resistance - 10 reps - 3 sets - 2x daily - 6x weekly  Shoulder Internal Rotation with Resistance - 10 reps - 3 sets - 2x daily - 6x weekly

## 2018-05-07 NOTE — Therapy (Signed)
[facility] [address] [place], [place], [address] Phone: [phone]   Fax:  [phone]  Physical Therapy Treatment  Patient Details  Name: Victoria Wilkins MRN: [id number] Date of Birth: 09/19/1965 Referring Provider (PT): Patrcia DollyKaren Aquino MD   Encounter Date: 05/07/2018  PT End of Session - 05/07/18 1052    Visit Number  8    Number of Visits  12    Date for PT Re-Evaluation  05/25/18    PT Start Time  0845   first 10 min on heat   PT Stop Time  0935    PT Time Calculation (min)  50 min    Activity Tolerance  Patient tolerated treatment well    Behavior During Therapy  [facility] for tasks assessed/performed       Past Medical History:  Diagnosis Date  . Migraines     No past surgical history on file.  There were no vitals filed for this visit.  Subjective Assessment - 05/07/18 0857    Subjective  Pt reports she has not had headache this week. She relays head and neck are feeling better but her Rt shoulder is hurting more. She also relays she no longer is having N/T down the rest of her arm into her hand. PT explained that this sounds like centralization and it is normal to have more pain in the shoulder as the radiculopathy moves out of Rt arm up to the shoulder.    Pertinent History  chronic migranes since kid    Limitations  Reading;Lifting;House hold activities    Diagnostic tests  MRI of brain, negative    Currently in Pain?  Yes    Pain Score  4    no pain in neck/head today   Pain Location  Shoulder    Pain Orientation  Right;Posterior;Lateral         [facility] PT Assessment - 05/07/18 0001      Strength   Strength Assessment Site  Shoulder    Right/Left Shoulder  Right    Right Shoulder Flexion  4+/5    Right Shoulder ABduction  4+/5    Right Shoulder Internal Rotation  5/5    Right Shoulder External Rotation  4+/5      Special Tests   Other special tests  Rt shoulder + impingment signs, +ulnar  nerve tension test but neg for other UE nerual tension tests.                    [facility] Adult PT Treatment/Exercise - 05/07/18 0001      Neck Exercises: Theraband   Other Theraband Exercises  Horizontal abd, bilat ER, Rt ER,Rt IR, rows,ext all with red TbandX 20 ea      Modalities   Modalities  Moist Heat      Moist Heat Therapy   Number Minutes Moist Heat  10 Minutes    Moist Heat Location  Shoulder;Cervical      Manual Therapy   Manual Therapy  Joint mobilization;Soft tissue mobilization;Myofascial release    Manual therapy comments  pt prone and supine    Joint Mobilization  gentle grade II CPAs of lower cervical and upper thoracic spine 2 x 30 sec each segment    Soft tissue mobilization  STM to B UT, B LS, R infra, R supra,R lat detoid, B cervical paraspinals    Myofascial Release  T.P release manual to UT bilat      Neck Exercises: Stretches  Other Neck Stretches  posterior capsule stretch for Rt shoulder 30 sec X 2             PT Education - 05/07/18 1052    Education Details  updated HEP to add in more scapular stabilization/RTC strength and Rt shoulder stretches    Person(s) Educated  Patient    Methods  Explanation;Demonstration;Verbal cues;Handout    Comprehension  Verbalized understanding;Returned demonstration          PT Long Term Goals - 04/13/18 1056      PT LONG TERM GOAL #1   Title  Pt will improve neck and Rt shoulder ROM to [facility]. 6 weeks 05/25/18    Status  New      PT LONG TERM GOAL #2   Title  Pt will be I and compliant with HEP. 6 weeks 05/25/18    Status  New      PT LONG TERM GOAL #3   Title  Pt will report reduced headaches to no more than 1-2 per month. 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 6 weeks 05/25/18    Status  New            Plan - 05/07/18 1200    Clinical Impression Statement  Pt is making  progress with neck pain and has not had a headache yet this week. She also appears to be having centralization of Rt UE radiculopathy as N/T is no longer in her hand but more in her posterior/lateral proximal shoulder. Shoulder was further screened today and she does exhibit signs of impingment so this can not be ruled out for contributing to her shoulder pain. She was prescribed more scaular stabilization and RTC strenghteninig today as well as shoulder stretching in efforts to further reduce he pain. HEP was undated to reflect these additions. MT continued for her neck and she did have some small improvements noted in tenderness and mobility of C-T spine.     Rehab Potential  Good    PT Frequency  2x / week    PT Duration  6 weeks    PT Treatment/Interventions  Cryotherapy;Electrical Stimulation;Iontophoresis 4mg /ml Dexamethasone;Moist Heat;Traction;Ultrasound;Therapeutic exercise;Therapeutic activities;Neuromuscular re-education;Manual techniques;Passive range of motion;Dry needling;Taping;Spinal Manipulations    PT Next Visit Plan  neck/shoulder stretching, strengthening, scap stabilization, Manual therapy, DN as needed    PT Home Exercise Plan  [id number], [id number]     Consulted and Agree with Plan of Care  Patient       Patient will benefit from skilled therapeutic intervention in order to improve the following deficits and impairments:  Decreased activity tolerance, Decreased endurance, Decreased range of motion, Decreased strength, Hypomobility, Increased fascial restricitons, Increased muscle spasms, Postural dysfunction, Pain  Visit Diagnosis: Cervicalgia  Acute pain of right shoulder  Chronic intractable headache, unspecified headache type     Problem List There are no active problems to display for this patient.   April Manson, PT,DPT 05/07/2018, 12:08 PM  [facility] [address] [place], [place],  [address] Phone: [phone]   Fax:  [phone]  Name: Victoria Wilkins MRN: [id number] Date of Birth: 05-18-1966

## 2018-05-11 ENCOUNTER — Ambulatory Visit: Payer: 59 | Admitting: Physical Therapy

## 2018-05-11 DIAGNOSIS — R519 Headache, unspecified: Secondary | ICD-10-CM

## 2018-05-11 DIAGNOSIS — M542 Cervicalgia: Secondary | ICD-10-CM | POA: Diagnosis not present

## 2018-05-11 DIAGNOSIS — M25511 Pain in right shoulder: Secondary | ICD-10-CM

## 2018-05-11 DIAGNOSIS — R51 Headache: Secondary | ICD-10-CM

## 2018-05-11 NOTE — Therapy (Addendum)
[facility] [address] [place], [place], [address] Phone: [phone]   Fax:  [phone]  Physical Therapy Treatment  Patient Details  Name: Victoria Wilkins MRN: [id number] Date of Birth: June 05, 1966 Referring Provider (PT): Ellouise Newer MD   Encounter Date: 05/11/2018  PT End of Session - 05/11/18 1208    Visit Number  9    Number of Visits  12    Date for PT Re-Evaluation  05/25/18    Authorization Type  [employer]HC    PT Start Time  1100   first 8 min on heat   PT Stop Time  1147    PT Time Calculation (min)  47 min    Activity Tolerance  Patient tolerated treatment well    Behavior During Therapy  [facility] for tasks assessed/performed       Past Medical History:  Diagnosis Date  . Migraines     No past surgical history on file.  There were no vitals filed for this visit.  Subjective Assessment - 05/11/18 1155    Subjective  Pt reports the arm pain is 85% improved and she is very pleased with this but the headache and neck pain returned on sunday. She feels it may be due to stress at work and getting things ready for holidays or could be from weaning off gabapentin    Pertinent History  chronic migranes since kid    Limitations  Reading;Lifting;House hold activities    Diagnostic tests  MRI of brain, negative    Currently in Pain?  Yes    Pain Score  4     Pain Location  Neck    Pain Orientation  Right;Left;Posterior    Pain Descriptors / Indicators  Aching;Headache         [facility] PT Assessment - 05/11/18 0001      AROM   Cervical Flexion  75%    Cervical Extension  75%    Cervical - Right Side Bend  75%    Cervical - Left Side Bend  50%    Cervical - Right Rotation  90%    Cervical - Left Rotation  90%                   [facility] Adult PT Treatment/Exercise - 05/11/18 0001      Neck Exercises: Supine   Neck Retraction  --   30 reps     Modalities   Modalities  Moist Heat      Moist Heat  Therapy   Number Minutes Moist Heat  10 Minutes    Moist Heat Location  Shoulder;Cervical      Manual Therapy   Manual Therapy  Joint mobilization;Soft tissue mobilization;Myofascial release    Manual therapy comments  pt prone and supine    Joint Mobilization  grade II-III CPAs and Rt unilat of lower cervical and upper thoracic spine 2 x 30 sec each segment. Then lateral cervical glides grade 1-2, and scap mobs    Soft tissue mobilization  STM to B UT, B LS, R infra, R supra,R lat detoid, B cervical paraspinals    Myofascial Release  T.P release manual to UT bilat, and Rt infra/supra                  PT Long Term Goals - 05/11/18 1237      PT LONG TERM GOAL #1   Title  Pt will improve neck and Rt shoulder ROM to [facility]. 6 weeks  05/25/18    Status  Partially Met      PT LONG TERM GOAL #2   Title  Pt will be I and compliant with HEP. 6 weeks 05/25/18    Status  On-going      PT LONG TERM GOAL #3   Title  Pt will report reduced headaches to no more than 1-2 per month. 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 6 weeks 05/25/18    Status  On-going            Plan - 05/11/18 1209    Clinical Impression Statement  Session primarily focused on manual therapy today to C-T spine and surrounding musculature as well as scapular region. She has made good improvements in neck ROM since last measurements see updated measurments above but pain levels continue to be up and down but are overall better since beginning PT. Continue POC     Rehab Potential  Good    PT Frequency  2x / week    PT Duration  6 weeks    PT Treatment/Interventions  Cryotherapy;Electrical Stimulation;Iontophoresis 86m/ml Dexamethasone;Moist Heat;Traction;Ultrasound;Therapeutic exercise;Therapeutic activities;Neuromuscular re-education;Manual techniques;Passive range of motion;Dry needling;Taping;Spinal  Manipulations    PT Next Visit Plan  may benefit from DN next session if can get with PT who can do this, continue neck/scapular strengthening and ROM    PT Home Exercise Plan  [id number], [id number]    Consulted and Agree with Plan of Care  Patient       Patient will benefit from skilled therapeutic intervention in order to improve the following deficits and impairments:  Decreased activity tolerance, Decreased endurance, Decreased range of motion, Decreased strength, Hypomobility, Increased fascial restricitons, Increased muscle spasms, Postural dysfunction, Pain  Visit Diagnosis: Cervicalgia  Acute pain of right shoulder  Chronic intractable headache, unspecified headache type     Problem List There are no active problems to display for this patient.   BDebbe Odea PT, DPT 05/11/2018, 12:38 PM  [address] [address]Phone: [phone]  Fax:  [phone] Name: AAngelic SchnelleMRN: [id number] of Birth: 115-Aug-1967 PHYSICAL THERAPY DISCHARGE SUMMARY  Visits from Start of Care: 9  Current functional level related to goals / functional outcomes: See above; 85% improvement, some return of headache   Remaining deficits: Headache symptoms, pain; unable to know true progress towards goals as patient did not return following last visit   Education / Equipment: HEP  Plan: Patient agrees to discharge.  Patient goals were not met. Patient is being discharged due to not returning since the last visit.  ?????    SLanney Gins PT, DPT Supplemental Physical Therapist 07/28/18 1:33 PM Pager: [phone]: [phone]

## 2018-05-12 ENCOUNTER — Ambulatory Visit: Payer: 59 | Admitting: Physical Therapy

## 2018-05-18 ENCOUNTER — Other Ambulatory Visit: Payer: Self-pay | Admitting: Neurology

## 2018-06-30 DIAGNOSIS — J0100 Acute maxillary sinusitis, unspecified: Secondary | ICD-10-CM | POA: Diagnosis not present

## 2018-08-25 ENCOUNTER — Ambulatory Visit: Payer: 59 | Admitting: Neurology

## 2019-10-11 ENCOUNTER — Other Ambulatory Visit: Payer: Self-pay | Admitting: Internal Medicine

## 2019-10-11 DIAGNOSIS — Z1231 Encounter for screening mammogram for malignant neoplasm of breast: Secondary | ICD-10-CM

## 2020-01-26 IMAGING — MR MR HEAD W/O CM
10 series · 48 of 48 positions shown · non-contrast
Comparison: None.

CLINICAL DATA: Headaches, migraines.  Paresthesia and numbness.

EXAM:
MRI HEAD WITHOUT CONTRAST
TECHNIQUE: Multiplanar, multiecho pulse sequences of the brain and surrounding
structures were obtained without intravenous contrast.

[Series 2: T1 · sagittal · 5.0mm · 0.45mm/px · 1 of 25 slices shown]
[im 1/25]
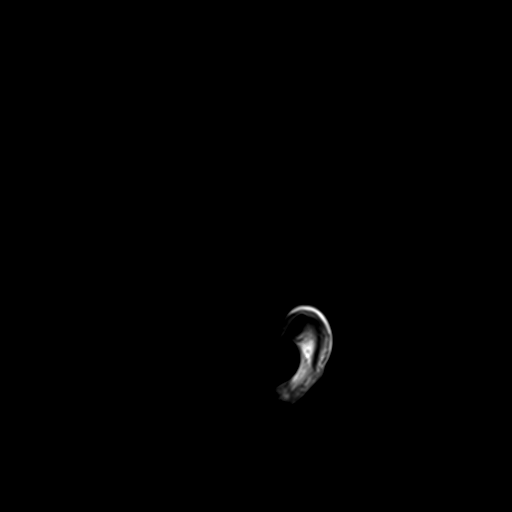

[Series 3: DWI · axial · 3.0mm · 1.80mm/px · z∈[-70,+83]mm · 9 of 102 slices shown (1 of 4)]
[im 1/102]
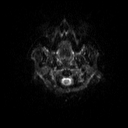
[im 13/102]
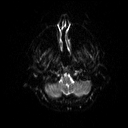
[im 26/102]
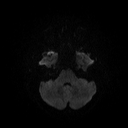
[im 38/102]
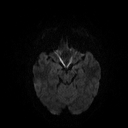
[im 51/102]
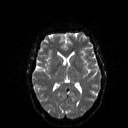
[im 64/102]
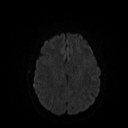
[im 76/102]
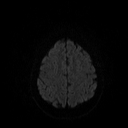
[im 89/102]
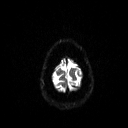
[im 102/102]
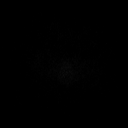

[Series 4: DWI · axial · 3.0mm · 1.80mm/px · z∈[-70,+83]mm · 4 of 52 slices shown (2 of 4)]
[im 1/52]
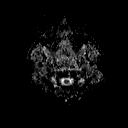
[im 18/52]
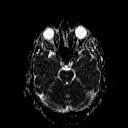
[im 35/52]
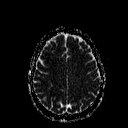
[im 52/52]
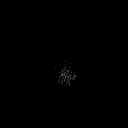

[Series 5: DWI · coronal · 5.0mm · 1.80mm/px · 7 of 85 slices shown (3 of 4)]
[im 1/85]
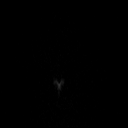
[im 15/85]
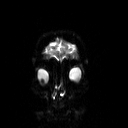
[im 29/85]
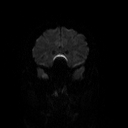
[im 43/85]
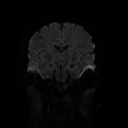
[im 57/85]
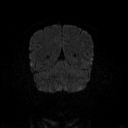
[im 71/85]
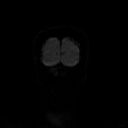
[im 85/85]
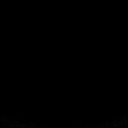

[Series 6: DWI · coronal · 5.0mm · 1.80mm/px · 4 of 43 slices shown (4 of 4)]
[im 1/43]
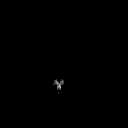
[im 15/43]
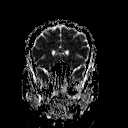
[im 29/43]
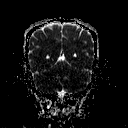
[im 43/43]
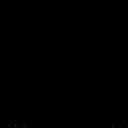

[Series 7: T2 · axial · 5.0mm · 0.51mm/px · z∈[-85,+76]mm · 2 of 25 slices shown (1 of 2)]
[im 1/25]
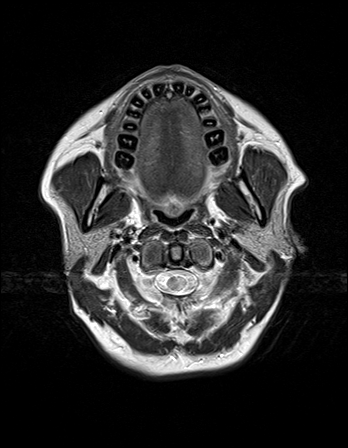
[im 25/25]
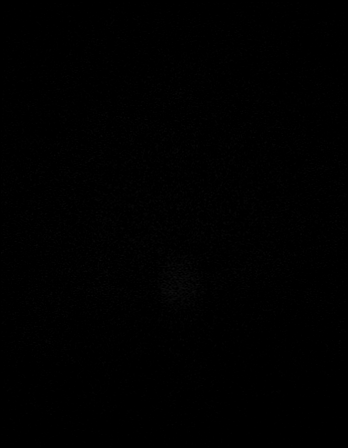

[Series 8: FLAIR · axial · 3.0mm · 0.45mm/px · z∈[-80,+82]mm · 3 of 36 slices shown]
[im 1/36]
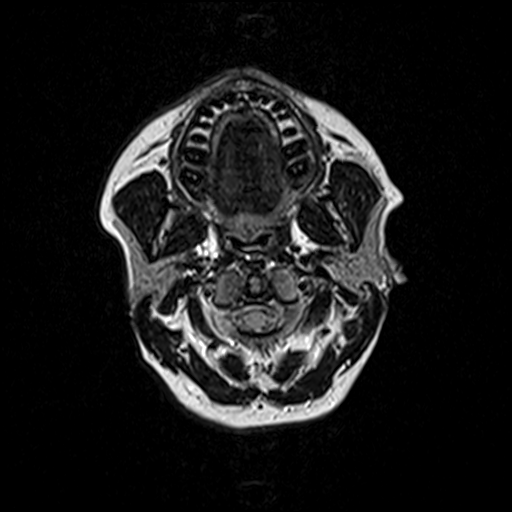
[im 18/36]
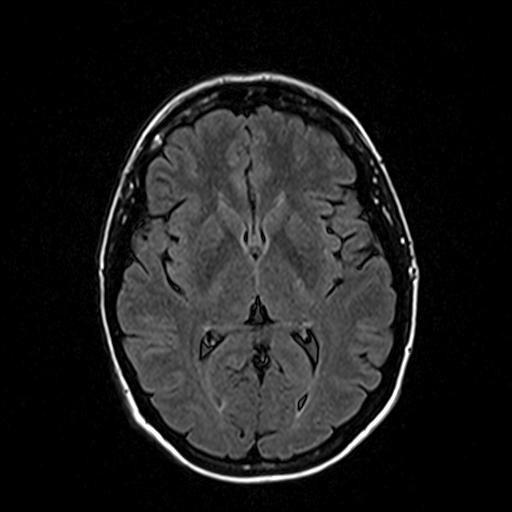
[im 36/36]
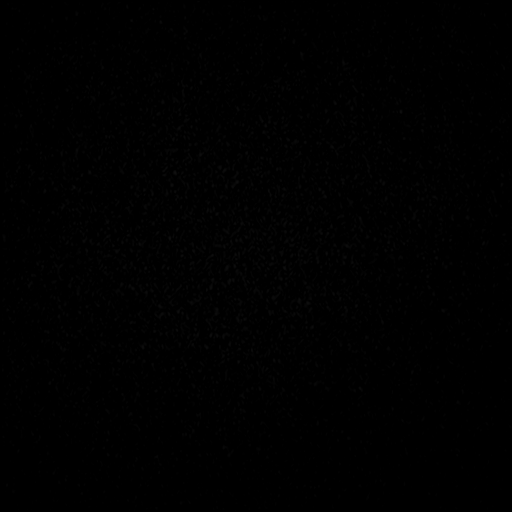

[Series 10: swi_images · axial · 5.0mm · 0.90mm/px · z∈[-67,+78]mm · 3 of 30 slices shown]
[im 1/30]
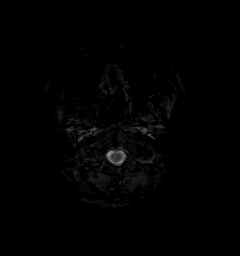
[im 15/30]
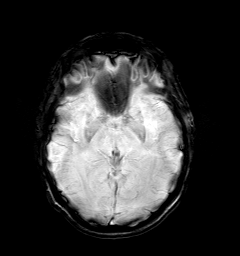
[im 30/30]
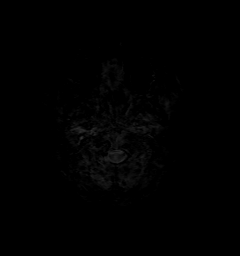

[Series 11: t1_mpr_tra · axial · 1.0mm · 0.75mm/px · z∈[-67,+76]mm · 12 of 144 slices shown]
[im 1/144]
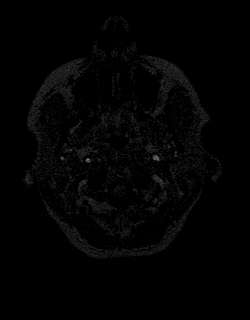
[im 14/144]
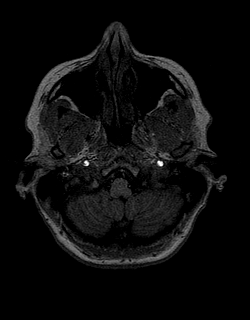
[im 27/144]
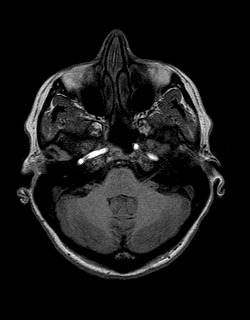
[im 40/144]
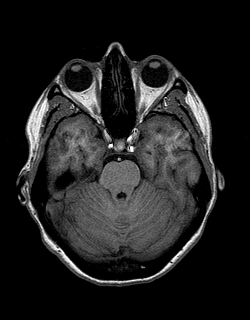
[im 53/144]
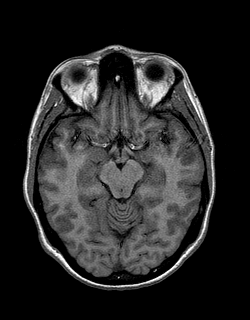
[im 66/144]
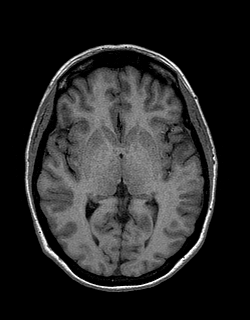
[im 79/144]
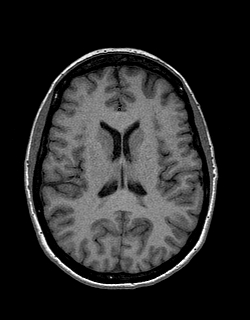
[im 92/144]
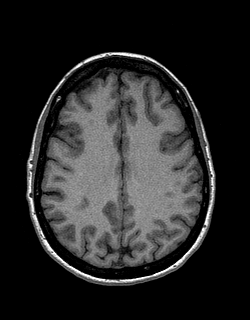
[im 105/144]
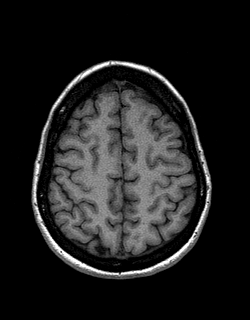
[im 118/144]
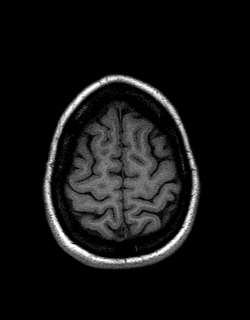
[im 131/144]
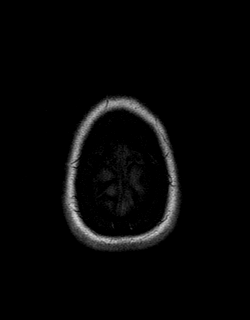
[im 144/144]
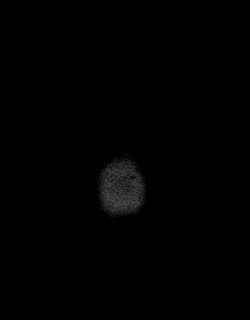

[Series 12: T2 · coronal · 5.0mm · 0.45mm/px · 3 of 31 slices shown (2 of 2)]
[im 1/31]
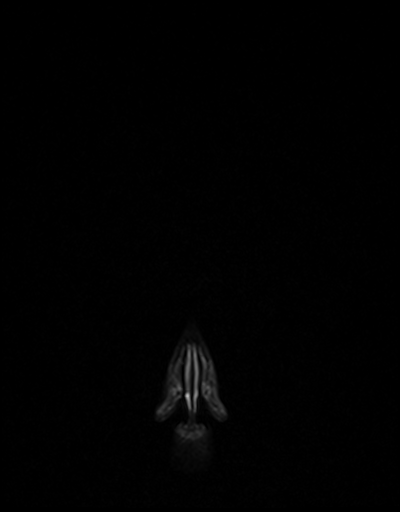
[im 16/31]
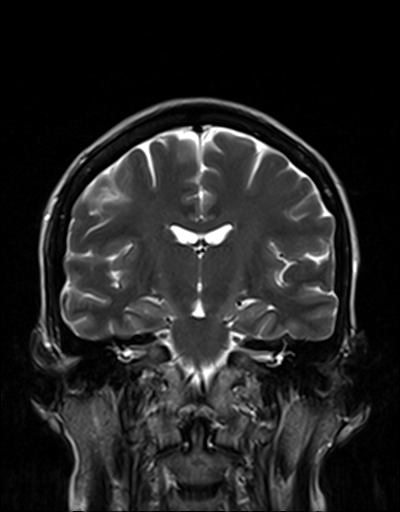
[im 31/31]
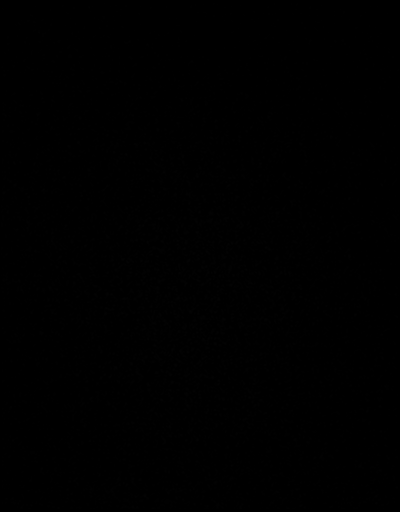

[48 of 48 positions shown; findings below may reference images not displayed]

FINDINGS: BRAIN: There is no acute infarct, acute hemorrhage or mass effect.
The midline structures are normal. There are no old infarcts. The
white matter signal is normal for the patient's age. The CSF spaces
are normal for age, with no hydrocephalus. Susceptibility-sensitive
sequences show no chronic microhemorrhage or superficial siderosis.

VASCULAR: Major intracranial arterial and venous sinus flow voids
are preserved.

SKULL AND UPPER CERVICAL SPINE: The visualized skull base,
calvarium, upper cervical spine and extracranial soft tissues are
normal.

SINUSES/ORBITS: No fluid levels or advanced mucosal thickening. No
mastoid or middle ear effusion. The orbits are normal.
IMPRESSION: Normal brain.

## 2022-03-28 ENCOUNTER — Emergency Department (HOSPITAL_COMMUNITY): Payer: Self-pay

## 2022-03-28 ENCOUNTER — Encounter (HOSPITAL_COMMUNITY): Payer: Self-pay

## 2022-03-28 ENCOUNTER — Emergency Department (HOSPITAL_COMMUNITY)
Admission: EM | Admit: 2022-03-28 | Discharge: 2022-03-28 | Disposition: A | Discharge status: Home / Self Care | Payer: Self-pay | Attending: Emergency Medicine | Admitting: Emergency Medicine

## 2022-03-28 ENCOUNTER — Other Ambulatory Visit: Payer: Self-pay

## 2022-03-28 DIAGNOSIS — S0083XA Contusion of other part of head, initial encounter: Secondary | ICD-10-CM | POA: Insufficient documentation

## 2022-03-28 DIAGNOSIS — W541XXA Struck by dog, initial encounter: Secondary | ICD-10-CM | POA: Insufficient documentation

## 2022-03-28 NOTE — Discharge Instructions (Signed)
Follow up with your Physician for recheck.  Return if any problems.  

## 2022-03-28 NOTE — ED Triage Notes (Signed)
Pt reports that a dog jumped into her face on the right side. Pt reports left side face pain now.

## 2022-03-29 NOTE — ED Provider Notes (Signed)
[facility] EMERGENCY DEPARTMENT Provider Note   CSN: [id number] Arrival date & time: 03/28/22  1502     History  Chief Complaint  Patient presents with   Facial Injury    Victoria Wilkins is a 56 y.o. female.  Pt complains of pain in her left jaw after her neighbors puppy jumped up and hit her in the right side of her jaw.  Pt reports pain with opening and closing her mouth.   The history is provided by the patient. No language interpreter was used.  Facial Injury Mechanism of injury:  Unable to specify Location:  Face Pain details:    Quality:  Aching   Severity:  Moderate   Timing:  Constant   Progression:  Worsening Foreign body present:  No foreign bodies Relieved by:  Nothing Worsened by:  Nothing Ineffective treatments:  None tried Associated symptoms: no headaches, no loss of consciousness and no neck pain   Risk factors: concern for non-accidental trauma        Home Medications Prior to Admission medications   Medication Sig Start Date End Date Taking? Authorizing Provider  acetaminophen (TYLENOL) 500 MG tablet Take 500 mg by mouth every 8 (eight) hours as needed.    [provider]  gabapentin (NEURONTIN) 100 MG capsule TAKE THREE CAPSULES BY MOUTH EVERY NIGHT 05/18/18   Cameron Sprang, MD  ibuprofen (ADVIL,MOTRIN) 200 MG tablet Take 200 mg by mouth every 6 (six) hours as needed.    [provider]  nortriptyline (PAMELOR) 10 MG capsule Take 1 capsule (10 mg total) by mouth at bedtime. 02/24/18   Cameron Sprang, MD  tiZANidine (ZANAFLEX) 4 MG tablet Take 1 tablet up to three times a day for neck pain/headache Patient not taking: Reported on 04/13/2018 02/02/18   Cameron Sprang, MD      Allergies    Patient has no known allergies.    Review of Systems   Review of Systems  Musculoskeletal:  Negative for neck pain.  Neurological:  Negative for loss of consciousness and headaches.  All other systems reviewed and are  negative.   Physical Exam Updated Vital Signs BP (!) 134/92 (BP Location: Right Arm)   Pulse 88   Temp 97.9 F (36.6 C) (Oral)   Resp 18   Ht 5\' 1"  (1.549 m)   Wt 58.5 kg   SpO2 100%   BMI 24.37 kg/m  Physical Exam Vitals and nursing note reviewed.  Constitutional:      Appearance: She is well-developed.  HENT:     Head: Normocephalic.     Comments: Tender left mandible, pain with opening her mouth.  Teeth appear aligned.     Right Ear: Tympanic membrane normal.     Left Ear: Tympanic membrane normal.     Nose: Nose normal.     Mouth/Throat:     Mouth: Mucous membranes are moist.  Eyes:     Extraocular Movements: Extraocular movements intact.     Pupils: Pupils are equal, round, and reactive to light.  Cardiovascular:     Rate and Rhythm: Normal rate.  Pulmonary:     Effort: Pulmonary effort is normal.  Musculoskeletal:        General: Normal range of motion.     Cervical back: Normal range of motion.  Skin:    General: Skin is warm.  Neurological:     General: No focal deficit present.     Mental Status: She is alert and oriented to person,  place, and time.  Psychiatric:        Mood and Affect: Mood normal.     ED Results / Procedures / Treatments   Labs (all labs ordered are listed, but only abnormal results are displayed) Labs Reviewed - No data to display  EKG None  Radiology CT Maxillofacial Wo Contrast  Result Date: 03/28/2022 CLINICAL DATA:  Dog bite on right side of face. EXAM: CT MAXILLOFACIAL WITHOUT CONTRAST TECHNIQUE: Multidetector CT imaging of the maxillofacial structures was performed. Multiplanar CT image reconstructions were also generated. RADIATION DOSE REDUCTION: This exam was performed according to the departmental dose-optimization program which includes automated exposure control, adjustment of the mA and/or kV according to patient size and/or use of iterative reconstruction technique. COMPARISON:  None Available. FINDINGS: Osseous:  There is no acute facial bone fracture. There is no evidence of mandibular dislocation. There is no suspicious osseous lesion. Orbits: The globes are intact. Orbital fat is normal bilaterally. There is no retrobulbar hematoma. Sinuses: There is trace mucosal thickening in the right maxillary sinus. Remainder of the paranasal sinuses and mastoid air cells are clear. The middle ear cavities are clear. Soft tissues: There is no significant soft tissue swelling. There is no organized fluid collection or hematoma. There is no soft tissue gas or radiopaque foreign body. Limited intracranial: The imaged portions of the intracranial compartment are unremarkable. IMPRESSION: No acute injury in the facial bones. No hematoma, fluid collection, soft tissue gas, or retained foreign body. Electronically Signed   By: Valetta Mole M.D.   On: 03/28/2022 16:41    Procedures Procedures    Medications Ordered in ED Medications - No data to display  ED Course/ Medical Decision Making/ A&P                           Medical Decision Making Pt reports puppy was playing and jumped hitting her in jaw.   Amount and/or Complexity of Data Reviewed Radiology: ordered and independent interpretation performed. Decision-making details documented in ED Course.    Details: Ct maxillofacial   no acute fracture  Risk OTC drugs.           Final Clinical Impression(s) / ED Diagnoses Final diagnoses:  Contusion of face, initial encounter    Rx / DC Orders ED Discharge Orders     None      An After Visit Summary was printed and given to the patient.    Fransico Meadow, [place] 03/29/22 1545    Noemi Chapel, MD 04/01/22 1230
